# Patient Record
Sex: Female | Born: 1965 | Race: White | Hispanic: No | Marital: Married | Smoking: Never smoker
Health system: Southern US, Community
[De-identification: ages and names within clinical notes are randomized; demographics above are authoritative.]

## PROBLEM LIST (undated history)

## (undated) ENCOUNTER — Emergency Department: Discharge status: Absent without leave | Payer: 59

---

## 2003-01-11 ENCOUNTER — Encounter: Admission: RE | Admit: 2003-01-11 | Discharge: 2003-01-11 | Payer: Self-pay | Admitting: Family Medicine

## 2003-01-11 ENCOUNTER — Encounter: Payer: Self-pay | Admitting: Family Medicine

## 2003-02-06 ENCOUNTER — Encounter (INDEPENDENT_AMBULATORY_CARE_PROVIDER_SITE_OTHER): Payer: Self-pay | Admitting: *Deleted

## 2003-02-06 ENCOUNTER — Ambulatory Visit (HOSPITAL_COMMUNITY): Admission: RE | Admit: 2003-02-06 | Discharge: 2003-02-06 | Payer: Self-pay | Admitting: Gastroenterology

## 2003-02-08 ENCOUNTER — Other Ambulatory Visit: Admission: RE | Admit: 2003-02-08 | Discharge: 2003-02-08 | Payer: Self-pay | Admitting: Obstetrics and Gynecology

## 2003-02-11 ENCOUNTER — Encounter: Payer: Self-pay | Admitting: Obstetrics and Gynecology

## 2003-02-11 ENCOUNTER — Ambulatory Visit (HOSPITAL_COMMUNITY): Admission: RE | Admit: 2003-02-11 | Discharge: 2003-02-11 | Payer: Self-pay | Admitting: Obstetrics and Gynecology

## 2003-02-21 ENCOUNTER — Encounter: Payer: Self-pay | Admitting: Obstetrics and Gynecology

## 2003-02-21 ENCOUNTER — Ambulatory Visit (HOSPITAL_COMMUNITY): Admission: RE | Admit: 2003-02-21 | Discharge: 2003-02-21 | Payer: Self-pay | Admitting: Obstetrics and Gynecology

## 2003-03-20 ENCOUNTER — Observation Stay (HOSPITAL_COMMUNITY): Admission: RE | Admit: 2003-03-20 | Discharge: 2003-03-21 | Payer: Self-pay | Admitting: *Deleted

## 2003-03-20 ENCOUNTER — Encounter (INDEPENDENT_AMBULATORY_CARE_PROVIDER_SITE_OTHER): Payer: Self-pay | Admitting: *Deleted

## 2004-12-24 ENCOUNTER — Ambulatory Visit (HOSPITAL_COMMUNITY): Admission: RE | Admit: 2004-12-24 | Discharge: 2004-12-24 | Payer: Self-pay | Admitting: Obstetrics and Gynecology

## 2005-02-26 ENCOUNTER — Other Ambulatory Visit: Admission: RE | Admit: 2005-02-26 | Discharge: 2005-02-26 | Payer: Self-pay | Admitting: Obstetrics and Gynecology

## 2006-03-24 ENCOUNTER — Ambulatory Visit (HOSPITAL_COMMUNITY): Admission: RE | Admit: 2006-03-24 | Discharge: 2006-03-24 | Payer: Self-pay | Admitting: Obstetrics and Gynecology

## 2006-04-04 ENCOUNTER — Other Ambulatory Visit: Admission: RE | Admit: 2006-04-04 | Discharge: 2006-04-04 | Payer: Self-pay | Admitting: Obstetrics and Gynecology

## 2007-03-30 ENCOUNTER — Ambulatory Visit (HOSPITAL_COMMUNITY): Admission: RE | Admit: 2007-03-30 | Discharge: 2007-03-30 | Payer: Self-pay | Admitting: Obstetrics and Gynecology

## 2008-04-23 ENCOUNTER — Ambulatory Visit (HOSPITAL_COMMUNITY): Admission: RE | Admit: 2008-04-23 | Discharge: 2008-04-23 | Payer: Self-pay | Admitting: Obstetrics and Gynecology

## 2009-05-30 ENCOUNTER — Ambulatory Visit (HOSPITAL_COMMUNITY): Admission: RE | Admit: 2009-05-30 | Discharge: 2009-05-30 | Payer: Self-pay | Admitting: Obstetrics and Gynecology

## 2010-06-12 ENCOUNTER — Ambulatory Visit (HOSPITAL_COMMUNITY): Admission: RE | Admit: 2010-06-12 | Discharge: 2010-06-12 | Payer: Self-pay | Admitting: Obstetrics and Gynecology

## 2011-01-01 NOTE — H&P (Signed)
Kara Best, Kara Best                          ACCOUNT NO.:  [id number]   MEDICAL RECORD NO.:  [id number]                   PATIENT TYPE:  AMB   LOCATION:  DAY                                  FACILITY:  [facility]   PHYSICIAN:  Huel Cote, M.D.              DATE OF BIRTH:  1966/06/04   DATE OF ADMISSION:  03/20/2003  DATE OF DISCHARGE:                                HISTORY & PHYSICAL   HISTORY OF PRESENT ILLNESS:  The patient is a 45 year old G3, P3, who is  being admitted for both an elective cholecystectomy given the finding of  gallstones on CT scan and some flare ups and abdominal pain related to this  problem. On the same CT scan the patient was noted to have a right adnexal  mass which appeared  most consistent with a dermoid cyst and  included  some  solid components measuring approximately  2 to 3 cm in size. Given that the  patient is undergoing  the procedure for her cholecystectomy, she wishes  a  simultaneous evaluation  and removal of this right ovarian tumor as well as  a visualization of  the left  ovary which has been noted to have a simple  cyst in the past.   PAST MEDICAL HISTORY:  1. Recently diagnosed ulcerative colitis for which she is on medication with     Dr. Loreta Ave.  2. Some history of borderline thyroid disease, although this  is not     currently requiring medication.   PAST SURGICAL HISTORY:  1. Postpartum tubal ligation in 1991.  2. Tonsillectomy in 1978.   GYNECOLOGIC HISTORY:  Significant for colposcopy approximately 1 years ago  to follow some abnormal Pap smears which was normal. The Pap smear performed  February 08, 2003, was normal.   FAMILY HISTORY:  Her family history has no breast cancer or colon cancer and  heart disease only in her paternal grandparents in their 97s.   OBSTETRIC HISTORY:  Significant for 3 normal spontaneous vaginal deliveries  in the past. 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 After  careful discussion, the patient wishes to proceed with having it removed.   PHYSICAL EXAMINATION:  GENERAL:  Height 5 feet, 5 inches. Weight 158 pounds.  VITAL SIGNS:  Blood pressure is normal.  HEART:  Regular rate and rhythm.  LUNGS:  Clear.  ABDOMEN:  Soft, nontender.  BREASTS:  No masses, discharge or adenopathy  noted.  PELVIC:  Normal external genitalia. The cervix has no lesions. The uterus is  retroverted but normal in size. The right adnexa palpates actually within  normal limits. The left adnexa palpates slightly enlarged.   LABORATORY DATA:  On ultrasound February 11, 2003, the patient had a normal  uterus. The right ovary measured normal at 4 x 2 x 3.5, however, there was a  focal round hypoechoic  mass in it,  corresponding to the fatty mass noted  on CT scan. The left ovary was normal with multiple  small cysts noted.   PLAN:  The patient was counseled as to the risks and benefits of proceeding  with surgery, given that she will already have a laparoscopic camera in  place to perform her cholecystectomy. We can use that same port through the  umbilicus  and will be able to visualize the ovaries. We will proceed with a  right oophorectomy, given the changes on that ovary. The patient  wishes  this rather then a cystectomy, and pathological evaluation of the ovary  specimen. Also we will evaluate the left ovary at the time of surgery for  any residual cyst.   The risks and benefits of the procedure were discussed with the patient in  detail, including  bleeding, infection and possible damage to bowel and  bladder. The patient understands the risks and desires to proceed with the  surgery as stated.                                               Huel Cote,  M.D.    KR/MEDQ  D:  03/19/2003  T:  03/19/2003  Job:  [id number]

## 2011-01-01 NOTE — Op Note (Signed)
   [name], Kara Best                          ACCOUNT NO.:  [id number]   MEDICAL RECORD NO.:  [id number]                   PATIENT TYPE:  OBV   LOCATION:  0464                                 FACILITY:  [facility]   PHYSICIAN:  Vikki Ports, M.D.         DATE OF BIRTH:  01/25/1966   DATE OF PROCEDURE:  03/20/2003  DATE OF DISCHARGE:  03/21/2003                                 OPERATIVE REPORT   PREOPERATIVE DIAGNOSIS:  Symptomatic cholelithiasis.   POSTOPERATIVE DIAGNOSIS:  Symptomatic cholelithiasis.   PROCEDURE:  Laparoscopic cholecystectomy.   SURGEON:  Vikki Ports, M.D.   ASSISTANT:  Gita Kudo, M.D.   ANESTHESIA:  General.   DESCRIPTION OF PROCEDURE:  The patient was taken to the operating room and  placed in the supine position.  After adequate general anesthesia was  induced, the abdomen was prepped and draped in the normal sterile fashion.  Using a transverse infraumbilical incision, we dissected down to the fascia.  The fascia was opened vertically.  An 0 Vicryl pursestring suture was placed  around the fascial defect.  Pneumoperitoneum was then obtained.  Under  direct visualization a 10 mm port was placed in the subxiphoid region and  two 5 mm ports were placed in the right abdomen.  The gallbladder was  identified and retracted cephalad and laterally.  The neck of the  gallbladder was identified, the cystic duct was dissected bluntly, a good  window was created behind it.  Its junction with the gallbladder was  verified, and it was triply clipped and divided.  The cystic artery was  dissected in a similar fashion, triply clipped and divided.  The gallbladder  was taken off the gallbladder bed using Bovie electrocautery and removed  through the umbilical port.  Adequate hemostasis was ensured.  At this point  Huel Cote, M.D., proceeded with laparoscopic right salpingo-  oophorectomy, which will be dictated in a separate report.                                             Vikki Ports, M.D.    KRH/MEDQ  D:  04/30/2003  T:  04/30/2003  Job:  [id number]

## 2011-01-01 NOTE — Op Note (Signed)
NAMESKII, CLELAND                          ACCOUNT NO.:  [id number]   MEDICAL RECORD NO.:  [id number]                   PATIENT TYPE:  OBV   LOCATION:  0464                                 FACILITY:  [facility]   PHYSICIAN:  Huel Cote, M.D.              DATE OF BIRTH:  1966-03-08   DATE OF PROCEDURE:  03/20/2003  DATE OF DISCHARGE:                                 OPERATIVE REPORT   PREOPERATIVE DIAGNOSES:  1. Cholelithiasis.  2. Right ovarian mass.   POSTOPERATIVE DIAGNOSES:  1. Cholelithiasis.  2. Right ovarian mass.  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   PROCEDURE:  Laparoscopic cholecystectomy by Dr. Luan Pulling and Dr. Maryagnes Amos,  laparoscopic right salpingo-oophorectomy by Dr. Senaida Ores and Dr.  Jackelyn Knife.   SURGEON:  Vikki Ports, M.D. and Gita Kudo, M.D., Huel Cote, M.D. with Zenaida Niece, M.D.   ANESTHESIA:  General   FLUIDS:  Estimated blood loss minimal, urine output 100 mL straight  catheterization prior to procedure. IV fluids 1200 mL.   FINDINGS:  A 2 cm right ovarian dermoid cyst.   DESCRIPTION OF PROCEDURE:  I entered the operating room as the patient had  already undergone her laparoscopic cholecystectomy with Dr. Luan Pulling and  was under general anesthesia in the dorsal lithotomy position. A uterine  manipulator with acorn tenaculum had already been placed prior to surgery  vaginally. The patient was draped in the dorsal lithotomy position with a  Hasson trocar in her umbilicus and an additional 12 mm trocar off the  midline in her right upper quadrant. She also had two additional 5 mm  trocars just adjacent to the midline on the right side. The pelvis was  inspected with a camera in the Hasson trocar and the right ovary noted  slightly enlarged with an approximately 2-3 cm dermoid presumed cyst. It was  smooth and mobile and there were no other areas of abnormality. The left  ovary appeared completely  within normal limits. The uterus appeared normal.  One additional 5 mm trocar was placed in the lower left quadrant after  injection with 0.5% Marcaine. The atraumatic grasper was then utilized to  grasp the ovary and tube and the utero-ovarian ligament and the  infundibulopelvic ligament were taken down with the ________ cutting cautery  unit sequentially thus completely removing the ovary and tube. The remaining  pedicle was also cauterized with Kleppinger cautery for excellent  hemostasis. There was no active bleeding noted. The ovary and tube were then  removed with an endobag placed through the Hasson umbilicus port and sent  off for frozen pathology. At this point, the Hasson was completely removed  and the fascia closed with a preplaced pursestring suture of #0 Vicryl which  was cinched down with no defect noted. The camera was introduced into the  other 12 mm port and all pedicles appeared hemostatic therefore the 5 mm  ports were removed under direct visualization with no active bleeding noted.  Finally the 12 mm port was removed after the pneumoperitoneum had been  reduced. The remaining ports were closed, one deep suture of #0 Vicryl was  placed in the 12 mm port and skin  closed with 3-0 Vicryl in a subcuticular stitch at all other port sites.  Sponge, lap and needle counts were correct x2. The tenaculum and acorn  manipulator were removed from the vagina and the patient was taken to the  recovery room in stable condition.                                               Huel Cote, M.D.    KR/MEDQ  D:  03/20/2003  T:  03/20/2003  Job:  [id number]

## 2011-01-01 NOTE — Op Note (Signed)
Kara Best, Kara Best                            ACCOUNT NO.:  [id number]   MEDICAL RECORD NO.:  [id number]                   PATIENT TYPE:  AMB   LOCATION:  ENDO                                 FACILITY:  [facility]   PHYSICIAN:  Anselmo Rod, M.D.               DATE OF BIRTH:  May 09, 1966   DATE OF PROCEDURE:  02/06/2003  DATE OF DISCHARGE:                                 OPERATIVE REPORT   PROCEDURE PERFORMED:  Colonoscopy with biopsies times eight.   ENDOSCOPIST:  Charna Elizabeth, M.D.   INSTRUMENT USED:  Olympus video colonoscope.   INDICATIONS FOR PROCEDURE:  The patient is a 45 year old white female with a  history of bloody mucoid stools, change in bowel habits and rule out  inflammatory bowel disease.  The patient is to be scheduled for laparoscopic  cholecystectomy with Vikki Ports, M.D. and removal of a left  ovarian cyst by Huel Cote, M.D.  Therefore, preoperative colonoscopy  is being done to rule out rectal bleeding.   PREPROCEDURE PREPARATION:  Informed consent was procured from the patient.  The patient was fasted for eight hours prior to the procedure and prepped  with a bottle of magnesium citrate and a gallon of GoLYTELY the night prior  to the procedure.   PREPROCEDURE PHYSICAL:  The patient had stable vital signs.  Neck supple.  Chest clear to auscultation.  S1 and S2 regular.  Abdomen soft with normal  bowel sounds.   DESCRIPTION OF PROCEDURE:  The patient was placed in left lateral decubitus  position and sedated with 80 mg of Demerol and 8 mg of Versed intravenously.  Once the patient was adequately sedated and maintained on low flow oxygen  and continuous cardiac monitoring, the Olympus video colonoscope was  advanced from the rectum to the cecum and terminal ileum without difficulty.  The transient ischemic attack appeared normal.  The appendicular orifice and  the ileocecal valve were clearly visualized and photographed.  The cecum,  right  colon, transverse colon and proximal left colon appeared normal.  Patchy inflammatory changes were noted in the left colon from 20 to 40 cm.  This was biopsied for pathology.  The rest of the colonic mucosa appeared  healthy with a normal vascular pattern.  Retroflexion in the rectum revealed  no abnormalities.  There was some extrinsic compression of the rectum which  may be from the patient's ovarian cyst.  No hemorrhoids were noted.  The  patient tolerated the procedure well without complications.  Biopsies were  done from 20 to 40 cm to confirm the diagnosis of inflammatory bowel  disease.   IMPRESSION:  1. Inflammatory changes from 20 to 40 cm in left colon.  2. Otherwise, normal-appearing proximal left colon, transverse colon, right     colon, cecum and terminal ileum.  3. Extrinsic compression of the rectum, question ovarian cyst.  RECOMMENDATIONS:  1. Await pathology results.  2. Outpatient follow-up in the next one week for further recommendations.                                                   Anselmo Rod, M.D.    JNM/MEDQ  D:  02/06/2003  T:  02/07/2003  Job:  [id number]   cc:   Teena Irani. Arlyce Dice, M.D.  [address]  [place]  [place] [address]  Fax: [phone]   Vikki Ports, M.D.  [address]  [place]  [place] [address]  Fax: [phone]   Huel Cote, M.D.  [address], [address]  [place], [place] [address]  Fax: [phone]

## 2011-08-18 ENCOUNTER — Other Ambulatory Visit (HOSPITAL_COMMUNITY): Payer: Self-pay | Admitting: Obstetrics and Gynecology

## 2011-08-18 DIAGNOSIS — Z1231 Encounter for screening mammogram for malignant neoplasm of breast: Secondary | ICD-10-CM

## 2011-09-23 ENCOUNTER — Ambulatory Visit (HOSPITAL_COMMUNITY)
Admission: RE | Admit: 2011-09-23 | Discharge: 2011-09-23 | Disposition: A | Discharge status: Home / Self Care | Payer: 59 | Source: Ambulatory Visit | Attending: Obstetrics and Gynecology | Admitting: Obstetrics and Gynecology

## 2011-09-23 DIAGNOSIS — Z1231 Encounter for screening mammogram for malignant neoplasm of breast: Secondary | ICD-10-CM | POA: Insufficient documentation

## 2012-01-11 ENCOUNTER — Ambulatory Visit (INDEPENDENT_AMBULATORY_CARE_PROVIDER_SITE_OTHER): Payer: 59 | Admitting: Family Medicine

## 2012-01-11 ENCOUNTER — Encounter: Payer: Self-pay | Admitting: Family Medicine

## 2012-01-11 VITALS — Ht 65.25 in | Wt 153.1 lb

## 2012-01-11 DIAGNOSIS — E785 Hyperlipidemia, unspecified: Secondary | ICD-10-CM

## 2012-01-11 NOTE — Patient Instructions (Addendum)
-   Eat at least 3 meals and 1-2 snacks per day.  Aim for no more than 5 hours between eating.   - Self-discipline: Diminishes as blood sugars drop.   - Meals should look like, taste like, feel like a REAL MEAL.   - Obtain twice as many veg's as protein or carbohydrate foods for both lunch and dinner; use fruit for snacks primarily.  - Protein at each meal:  Beans, soy, meat, fish, poultry, dairy foods, eggs, (nuts and seeds).    Nuts & seeds:  Limit to 2-3 tbsp/day; XV olive oil is excellent, but be careful about quantity.  (High-protein pasta:  [place] pasta) - Plan left-overs from foods on hand:  - Colored peppers over wholewheat pasta - Zucch, squash, onion, or asparagus roasted - Spinach, berries (or tomatoes in omoelette) - Cheese sticks - Skim milk - Fruit to eat or in smoothie - Hummus w/ chapati - Steel-cut oats in yogurt - Shredded Wheat w/ milk - Grits, millet - Chicken breast - Edamame, beans ([employer] Foods has canned no-salt-added for <$1) - Breakfast:  Include protein, fruit, at least one starch, i.e., egg (or nut butter) sandwich on Ezekial Eng muffin plus fruit; cereal, milk or yogurt, fruit; Cooked cereal and fruit, glass of milk - Snack: Mix & Match: Fruit, yogurt, cheese, 1/2 sandwich, nuts/seeds, hummus, cereal, raw veg's, whole-grain crackers, popcorn:  100-300 calories.   - Email to [email or web address]:  List of meals.  I will review and make suggestions.

## 2012-01-11 NOTE — Progress Notes (Signed)
Medical Nutrition Therapy:  Appt start time: 1400 end time:  1500.  Assessment:  Primary concerns today: Weight management and lipids management.  Ms. Bowdish has a fam hx of hyperlipidemia and hypertension.  She has been working on lowering each of these in the few months.    Usual eating pattern is erratic.  Work schedule starts at [employer] AM 3 X wk, working for [employer] at [employer].  She works 9:15-5:45 twice a week.  Consequently, her sleep schedule is varied thru the week.  She also has children, one married, one in [place], and one in college.  She & her husband have been trying to improve their diets together.  Ms. Deringer seems to have very all-or-none approach to food choices.  She also has a history of ulcerative colitis.    Everyday foods include decaf coffee with Starbuck's hot choc mix (as of today switching to Truvia/stevia and cocoa powder).  Avoided foods include sugar, alcohol, processed and fast foods, most fish/seafood, and most red meat.  Ms. Oconnell started losing wt using the Frederick Peers, using only vegan foods for about a month.  Subsequent lab work showed a low B12 level.  She has since added some animal products (dairy and occasional meat).   24-hr recall (worked yesterday, 4:15 -11:45) was somewhat atypical in that they had a burger and ice cream: B (5:30 AM)-    3/4 c Greek nonfat yogurt, 1 c blueberries Snk (9:45 AM)-   1 c homemade veg soup, chx breast, salad, olive oil & vinegar, water Snk (11:15 AM)-  Starbuck's 16 oz nonfat decaf Mocha Snk (1 PM)-   1/2 c microwaved popcorn in olive oil, salt, water D ( PM)-   1 grilled hamburger, avocado, let, tomato, watermelon, 1/2 c ice cream, water Snk ( PM)-   1/2 orange  Usual physical activity includes water aerobics, walking, or elliptical/bike ~35-60 min 5 X wk.  Progress Towards Goal(s):  In progress.   Nutritional Diagnosis:  NB-1.1 Food and nutrition-related knowledge deficit As related to moderation  and balance.  As evidenced by self-report of history of following diet plans briefly, but discontinuing after a while.    Intervention:  Nutrition education.  Monitoring/Evaluation:  Dietary intake, exercise, and body weight in 4 weeks.

## 2012-02-15 ENCOUNTER — Ambulatory Visit: Payer: 59 | Admitting: Family Medicine

## 2012-07-24 ENCOUNTER — Encounter: Payer: Self-pay | Admitting: Sports Medicine

## 2012-07-24 ENCOUNTER — Ambulatory Visit (INDEPENDENT_AMBULATORY_CARE_PROVIDER_SITE_OTHER): Payer: 59 | Admitting: Sports Medicine

## 2012-07-24 VITALS — BP 131/81 | HR 97 | Wt 164.0 lb

## 2012-07-24 DIAGNOSIS — M79671 Pain in right foot: Secondary | ICD-10-CM

## 2012-07-24 DIAGNOSIS — M722 Plantar fascial fibromatosis: Secondary | ICD-10-CM | POA: Insufficient documentation

## 2012-07-24 DIAGNOSIS — R269 Unspecified abnormalities of gait and mobility: Secondary | ICD-10-CM | POA: Insufficient documentation

## 2012-07-24 DIAGNOSIS — M79609 Pain in unspecified limb: Secondary | ICD-10-CM

## 2012-07-24 NOTE — Assessment & Plan Note (Addendum)
With pain chronically underneath the right first metatarsal head, but predominantly over the flexor hallucis longus tendon both proximal and distal to the first metatarsal head, but not just under it. We did make custom orthotics today, we will try these for a week, after which we will consider placing a first metatarsal ray post if not improved. Next would also be consideration of ultrasound guided flexor hallucis longus tendon sheath injection, versus first metatarsophalangeal joint injection for diagnostic and therapeutic purposes. Home rehabilitation.

## 2012-07-24 NOTE — Assessment & Plan Note (Signed)
See above for assessment for foot pain.

## 2012-07-24 NOTE — Progress Notes (Signed)
SPORTS MEDICINE CONSULTATION REPORT  Subjective:    CC: Foot pain  HPI: I met with the at the [employer] health fair. She is a very pleasant 46 year old female who spends all day on her feet. Unfortunately, chronically she developed pain which he localizes on the plantar aspect of the right great toe just proximal, and just distal to the metatarsophalangeal joint, but not over the joint itself. She did have x-rays done with a different provider that were essentially normal per patient. She has been in multiple different types of orthotics, and all given her some benefit, but none of really taken away the pain completely. The pain is localized, does not radiate, and his only present with specific areas of palpation, as well as with weight bearing all day. She is interested in custom orthotics.  Past medical history, Surgical history, Family history, Social history, Allergies, and medications have been entered into the medical record, reviewed, and no changes needed.   Review of Systems: No headache, visual changes, nausea, vomiting, diarrhea, constipation, dizziness, abdominal pain, skin rash, fevers, chills, night sweats, weight loss, swollen lymph nodes, body aches, joint swelling, muscle aches, chest pain, shortness of breath, mood changes, visual or auditory hallucinations.   Objective:   Vitals:  Afebrile, vital signs stable. General: Well Developed, well nourished, and in no acute distress.  Neuro/Psych: Alert and oriented x3, extra-ocular muscles intact, able to move all 4 extremities.  Skin: Warm and dry, no rashes noted.  Respiratory: Not using accessory muscles, speaking in full sentences, trachea midline.  Cardiovascular: Pulses palpable, no extremity edema. Abdomen: Does not appear distended. Right Ankle: No visible erythema or swelling. Range of motion is full in all directions. Strength is 5/5 in all directions. Stable lateral and medial ligaments; squeeze test and kleiger  test unremarkable; Talar dome nontender; No pain at base of 5th MT; No tenderness over cuboid; No tenderness over N spot or navicular prominence No tenderness on posterior aspects of lateral and medial malleolus No sign of peroneal tendon subluxations or tenderness to palpation Negative tarsal tunnel tinel's Able to walk 4 steps. There is a discrete area of tenderness to palpation all on what feels to be the flexor hallucis longus tendon, there is no tenderness to palpation over the metatarsophalangeal joint itself. I also cannot reproduce this pain with resisted plantar flexion of the great toe, or small toes, and she has no pain behind the malleolus.  Patient was fitted for a : standard, cushioned, semi-rigid orthotic. The orthotic was heated and afterward the patient stood on the orthotic blank positioned on the orthotic stand. The patient was positioned in subtalar neutral position and 10 degrees of ankle dorsiflexion in a weight bearing stance. After completion of molding, a stable base was applied to the orthotic blank. The blank was ground to a stable position for weight bearing. Size: 7 Base: Blue EVA Additional Posting and Padding: None The patient ambulated these, and they were very comfortable.  Impression and Recommendations:   This case required medical decision making of moderate complexity.  I spent over 45 minutes with this patient, 50% was face-to-face time.

## 2012-07-28 ENCOUNTER — Encounter: Payer: Self-pay | Admitting: Family Medicine

## 2012-08-14 ENCOUNTER — Ambulatory Visit (INDEPENDENT_AMBULATORY_CARE_PROVIDER_SITE_OTHER): Payer: Self-pay | Admitting: Sports Medicine

## 2012-08-14 ENCOUNTER — Encounter: Payer: Self-pay | Admitting: Sports Medicine

## 2012-08-14 VITALS — BP 133/87 | HR 88 | Wt 167.0 lb

## 2012-08-14 DIAGNOSIS — M79671 Pain in right foot: Secondary | ICD-10-CM

## 2012-08-14 DIAGNOSIS — M79609 Pain in unspecified limb: Secondary | ICD-10-CM

## 2012-08-14 NOTE — Progress Notes (Signed)
SPORTS MEDICINE CONSULTATION REPORT  Subjective:    CC: Followup  HPI: Kara Best comes back to see me for followup of her right foot pain. She initially had pain that she localized over the mid and distal plantar fascia. I built her custom orthotics, and we avoided NSAIDs due to her ulcerative colitis.  Unfortunately she notes that the pain is not much better. She has been working aggressively on the rehabilitation exercises. She does note that her orthotics feel somewhat too short. It is localized, doesn't radiate, is moderate. She does work for [employer] and spends all day on her feet  Past medical history, Surgical history, Family history, Social history, Allergies, and medications have been entered into the medical record, reviewed, and no changes needed.   Review of Systems: No headache, visual changes, nausea, vomiting, diarrhea, constipation, dizziness, abdominal pain, skin rash, fevers, chills, night sweats, weight loss, swollen lymph nodes, body aches, joint swelling, muscle aches, chest pain, shortness of breath, mood changes, visual or auditory hallucinations.   Objective:   Vitals:  Afebrile, vital signs stable. General: Well Developed, well nourished, and in no acute distress.  Neuro/Psych: Alert and oriented x3, extra-ocular muscles intact, able to move all 4 extremities.  Skin: Warm and dry, no rashes noted.  Respiratory: Not using accessory muscles, speaking in full sentences, trachea midline.  Cardiovascular: Pulses palpable, no extremity edema. Abdomen: Does not appear distended. Right foot: Her foot shape is neutral, she has tenderness to palpation as well as palpable nodularity along the distal are fascia medially. This is consistent with plantar fascia fibromatosis.  Patient was fitted for a : standard, cushioned, semi-rigid orthotic. The orthotic was heated and afterward the patient stood on the orthotic blank positioned on the orthotic stand. The patient was  positioned in subtalar neutral position and 10 degrees of ankle dorsiflexion in a weight bearing stance. After completion of molding, a stable base was applied to the orthotic blank. The blank was ground to a stable position for weight bearing. Size: 10 Base: Blue EVA Additional Posting and Padding: None The patient ambulated these, and they were very comfortable.  Procedure: Real-time Ultrasound Guided Injection of right plantar fascia fibromatosis. Device: GE Logiq E  Ultrasound guided injection is preferred based studies that show increased duration, increased effect, greater accuracy, decreased procedural pain, increased response rate, and decreased cost with ultrasound guided versus blind injection.  Verbal informed consent obtained.  Time-out conducted.  Noted no overlying erythema, induration, or other signs of local infection.  Skin prepped in a sterile fashion.  Local anesthesia: Topical Ethyl chloride.  With sterile technique and under real time ultrasound guidance:  I noted edema superficial to one and surrounding the distal plantar fascia in a somewhat localized area corresponding to her pain. I advanced the needle in the short axis into this area of edema, I injected 1 cc Kenalog 40, 3 cc lidocaine and watched it spread proximally and distally over the plantar fascia. Completed without difficulty  Pain immediately resolved suggesting accurate placement of the medication.  Advised to call if fevers/chills, erythema, induration, drainage, or persistent bleeding.  Images permanently stored and available for review in the ultrasound unit.  Impression: Technically successful ultrasound guided injection.  Impression and Recommendations:   This case required medical decision making of moderate complexity.  I spent over 40 minutes with this patient, greater than 50% was face-to-face counseling regarding plantar fascia fibromatosis.

## 2012-08-14 NOTE — Assessment & Plan Note (Signed)
Her still some pain on the right side over the plantar fascia suggestive of plantar fascia fibromatosis. There is no pain at the calcaneal insertion of the plantar fascia. Previous orthotics were too short, are reconstructed longer ones. I also injected over the plantar fascia under guidance around an area of edema where some fibromatosis was seen. I would like to see her back in a few weeks to see if things are going, again, I can add a scaphoid pattern first metatarsal ray post if she continues to have plantar fascia fibromatosis pain.

## 2012-09-05 ENCOUNTER — Ambulatory Visit: Payer: Self-pay | Admitting: Sports Medicine

## 2012-09-14 ENCOUNTER — Ambulatory Visit: Payer: Self-pay | Admitting: Sports Medicine

## 2012-09-21 ENCOUNTER — Ambulatory Visit: Payer: Self-pay | Admitting: Sports Medicine

## 2012-09-30 ENCOUNTER — Other Ambulatory Visit: Payer: Self-pay

## 2013-01-02 ENCOUNTER — Other Ambulatory Visit (HOSPITAL_COMMUNITY): Payer: Self-pay | Admitting: Obstetrics and Gynecology

## 2013-01-02 DIAGNOSIS — Z1231 Encounter for screening mammogram for malignant neoplasm of breast: Secondary | ICD-10-CM

## 2013-02-05 ENCOUNTER — Ambulatory Visit (HOSPITAL_COMMUNITY): Payer: Self-pay

## 2013-02-12 ENCOUNTER — Ambulatory Visit (HOSPITAL_COMMUNITY): Payer: 59

## 2013-02-19 ENCOUNTER — Ambulatory Visit (HOSPITAL_COMMUNITY)
Admission: RE | Admit: 2013-02-19 | Discharge: 2013-02-19 | Disposition: A | Discharge status: Home / Self Care | Payer: 59 | Source: Ambulatory Visit | Attending: Obstetrics and Gynecology | Admitting: Obstetrics and Gynecology

## 2013-02-19 DIAGNOSIS — Z1231 Encounter for screening mammogram for malignant neoplasm of breast: Secondary | ICD-10-CM | POA: Insufficient documentation

## 2013-06-21 ENCOUNTER — Other Ambulatory Visit: Payer: Self-pay

## 2013-11-14 ENCOUNTER — Encounter: Payer: Self-pay | Admitting: Sports Medicine

## 2013-11-14 ENCOUNTER — Ambulatory Visit (INDEPENDENT_AMBULATORY_CARE_PROVIDER_SITE_OTHER): Payer: 59 | Admitting: Sports Medicine

## 2013-11-14 VITALS — BP 134/89 | HR 89 | Ht 65.0 in | Wt 165.0 lb

## 2013-11-14 DIAGNOSIS — M79673 Pain in unspecified foot: Secondary | ICD-10-CM

## 2013-11-14 DIAGNOSIS — Q742 Other congenital malformations of lower limb(s), including pelvic girdle: Secondary | ICD-10-CM

## 2013-11-14 DIAGNOSIS — M79609 Pain in unspecified limb: Secondary | ICD-10-CM

## 2013-11-14 DIAGNOSIS — M25579 Pain in unspecified ankle and joints of unspecified foot: Secondary | ICD-10-CM

## 2013-11-14 NOTE — Progress Notes (Signed)
CC: Bilateral foot pain HPI: Patient presents for evaluation of bilateral foot pain. This is been bothering her for about 10 years. She has had 2 sets of custom orthotics in the past including cushioned orthotics made by Dr. Althea CharonMcKinley. Unfortunately both of these made her pain worse. She notes that she is developing a bunion on her right foot. She also has had very prominent bone spurs over her medial feet at the area of the navicular. She actually had surgery on the right foot to remove this bony prominence. She notes pain at the inferior aspect of where they removed this bone. She is wondering if there might be something that we can do to help her with her foot pain. She has to stand all day at work and has a lot of difficulty doing this.  ROS: As above in the HPI. All other systems are stable or negative.  OBJECTIVE: APPEARANCE:  Patient in no acute distress.The patient appeared well nourished and normally developed. HEENT: No scleral icterus. Conjunctiva non-injected Resp: Non labored Skin: No rash MSK:  Left foot: - Large accessory navicular is prominent and extends past the medial malleolus on the left foot - Large bone spur off of medial malleolus - Hallux rigidus - Pes planus with mild pronation Right foot - Status post surgery over the navicular - Hallux limitus - External rotation of the right foot with walking  Normal inversion of the heel with heel raise bilaterally, but more inversion on the right   MSK US: Not performed   ASSESSMENT: #1. 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 We will try her in a soft cushioned diabetic foot insert with small blue foam pad over the arch to try to cushion the excess or navicular. In her other dress  shoes we added a blue foam pad to the posterior half of the insole. She will try these for the next 3-4 weeks and followup with us for additional changes if needed.

## 2013-12-13 ENCOUNTER — Ambulatory Visit: Payer: Self-pay | Admitting: Sports Medicine

## 2014-02-27 ENCOUNTER — Other Ambulatory Visit (HOSPITAL_COMMUNITY): Payer: Self-pay | Admitting: Obstetrics and Gynecology

## 2014-02-27 DIAGNOSIS — Z1231 Encounter for screening mammogram for malignant neoplasm of breast: Secondary | ICD-10-CM

## 2014-03-08 ENCOUNTER — Ambulatory Visit (HOSPITAL_COMMUNITY)
Admission: RE | Admit: 2014-03-08 | Discharge: 2014-03-08 | Disposition: A | Discharge status: Home / Self Care | Payer: 59 | Source: Ambulatory Visit | Attending: Obstetrics and Gynecology | Admitting: Obstetrics and Gynecology

## 2014-03-08 DIAGNOSIS — Z1231 Encounter for screening mammogram for malignant neoplasm of breast: Secondary | ICD-10-CM | POA: Insufficient documentation

## 2016-01-28 ENCOUNTER — Emergency Department (INDEPENDENT_AMBULATORY_CARE_PROVIDER_SITE_OTHER)
Admission: EM | Admit: 2016-01-28 | Discharge: 2016-01-28 | Disposition: A | Discharge status: Home / Self Care | Payer: 59 | Source: Home / Self Care | Attending: Family Medicine | Admitting: Family Medicine

## 2016-01-28 DIAGNOSIS — J029 Acute pharyngitis, unspecified: Secondary | ICD-10-CM

## 2016-01-28 LAB — POCT RAPID STREP A (OFFICE): Rapid Strep A Screen: NEGATIVE

## 2016-01-28 NOTE — ED Notes (Signed)
Sore throat, headache, body aches since yesterday.  °

## 2016-01-28 NOTE — ED Provider Notes (Signed)
CSN: [id number]     Arrival date & time 01/28/16  [id number] History   First MD Initiated Contact with Patient 01/28/16 0831     Chief Complaint  Patient presents with  . Sore Throat      HPI Comments: Patient awoke yesterday with headache, sore throat, fatigue, and myalgias.  The history is provided by the patient.    No past medical history on file. No past surgical history on file. No family history on file. Social History  Substance Use Topics  . Smoking status: Never Smoker   . Smokeless tobacco: Not on file  . Alcohol Use: No   OB History    No data available     Review of Systems + sore throat No cough No pleuritic pain No wheezing No nasal congestion No post-nasal drainage No sinus pain/pressure No itchy/red eyes No earache No hemoptysis No SOB No fever/chills No nausea No vomiting No abdominal pain No diarrhea No urinary symptoms No skin rash + fatigue + myalgias + headache Used OTC meds without relief  Allergies  Ciprofloxacin and Sulfa antibiotics  Home Medications   Prior to Admission medications   Medication Sig Start Date End Date Taking? Authorizing Provider  diphenhydrAMINE (SIMPLY SLEEP) 25 MG tablet Take 25 mg by mouth at bedtime as needed.    Historical Provider, MD  levothyroxine (SYNTHROID, LEVOTHROID) 75 MCG tablet Take 75 mcg by mouth daily.    Historical Provider, MD  mesalamine (LIALDA) 1.2 G EC tablet Take 1,200 mg by mouth daily with breakfast.    Historical Provider, MD   Meds Ordered and Administered this Visit  Medications - No data to display  BP 130/87 mmHg  Pulse 105  Temp(Src) 98.5 F (36.9 C) (Oral)  Ht 5\' 5"  (1.651 m)  Wt 165 lb (74.844 kg)  BMI 27.46 kg/m2  SpO2 100%  LMP 01/19/2013 No data found.   Physical Exam Nursing notes and Vital Signs reviewed. Appearance:  Patient appears stated age, and in no acute distress Eyes:  Pupils are equal, round, and reactive to light and accomodation.  Extraocular movement  is intact.  Conjunctivae are not inflamed  Ears:  Canals normal.  Tympanic membranes normal.  Nose:  Mildly congested turbinates.  No sinus tenderness.    Pharynx:  Mild erythema Neck:  Supple.  Tender enlarged posterior/lateral nodes are palpated bilaterally  Lungs:  Clear to auscultation.  Breath sounds are equal.  Moving air well. Heart:  Regular rate and rhythm without murmurs, rubs, or gallops.  Abdomen:  Nontender without masses or hepatosplenomegaly.  Bowel sounds are present.  No CVA or flank tenderness.  Extremities:  No edema.  Skin:  No rash present.   ED Course  Procedures none    Labs Reviewed  POCT RAPID STREP A (OFFICE) negative      MDM   1. Acute pharyngitis, unspecified etiology; suspect early viral URI   There is no evidence of bacterial infection today.  Treat symptomatically for now  Throat culture pending.  If increasing cold-like symptoms develop, try the following: Take plain guaifenesin (1200mg  extended release tabs such as Mucinex) twice daily, with plenty of water, for cough and congestion.  May add Pseudoephedrine (30mg , one or two every 4 to 6 hours) for sinus congestion.  Get adequate rest.   May use Afrin nasal spray (or generic oxymetazoline) twice daily for about 5 days and then discontinue.  Also recommend using saline nasal spray several times daily and saline nasal irrigation (AYR is  a common brand).  Use Flonase nasal spray each morning after using Afrin nasal spray and saline nasal irrigation. Try warm salt water gargles for sore throat.  Stop all antihistamines for now, and other non-prescription cough/cold preparations. May take Tylenol, or low dose ibuprofen for sore throat, headache, body aches, etc. May take Delsym Cough Suppressant at bedtime for nighttime cough.  Follow-up with family doctor if not improving about 7 to10 days.     Lattie Haw, MD 01/28/16 0900

## 2016-01-28 NOTE — Discharge Instructions (Signed)
If increasing cold-like symptoms develop, try the following: Take plain guaifenesin (1200mg  extended release tabs such as Mucinex) twice daily, with plenty of water, for cough and congestion.  May add Pseudoephedrine (30mg , one or two every 4 to 6 hours) for sinus congestion.  Get adequate rest.   May use Afrin nasal spray (or generic oxymetazoline) twice daily for about 5 days and then discontinue.  Also recommend using saline nasal spray several times daily and saline nasal irrigation (AYR is a common brand).  Use Flonase nasal spray each morning after using Afrin nasal spray and saline nasal irrigation. Try warm salt water gargles for sore throat.  Stop all antihistamines for now, and other non-prescription cough/cold preparations. May take Tylenol, or low dose ibuprofen for sore throat, headache, body aches, etc. May take Delsym Cough Suppressant at bedtime for nighttime cough.  Follow-up with family doctor if not improving about 7 to10 days.    Strep Throat Strep throat is a bacterial infection of the throat. Your health care provider may call the infection tonsillitis or pharyngitis, depending on whether there is swelling in the tonsils or at the back of the throat. Strep throat is most common during the cold months of the year in children who are 525-52 years of age, but it can happen during any season in people of any age. This infection is spread from person to person (contagious) through coughing, sneezing, or close contact. CAUSES Strep throat is caused by the bacteria called Streptococcus pyogenes. RISK FACTORS This condition is more likely to develop in:  People who spend time in crowded places where the infection can spread easily.  People who have close contact with someone who has strep throat. SYMPTOMS Symptoms of this condition include:  Fever or chills.   Redness, swelling, or pain in the tonsils or throat.  Pain or difficulty when swallowing.  White or yellow spots on  the tonsils or throat.  Swollen, tender glands in the neck or under the jaw.  Red rash all over the body (rare). DIAGNOSIS This condition is diagnosed by performing a rapid strep test or by taking a swab of your throat (throat culture test). Results from a rapid strep test are usually ready in a few minutes, but throat culture test results are available after one or two days. TREATMENT This condition is treated with antibiotic medicine. HOME CARE INSTRUCTIONS Medicines  Take over-the-counter and prescription medicines only as told by your health care provider.  Take your antibiotic as told by your health care provider. Do not stop taking the antibiotic even if you start to feel better.  Have family members who also have a sore throat or fever tested for strep throat. They may need antibiotics if they have the strep infection. Eating and Drinking  Do not share food, drinking cups, or personal items that could cause the infection to spread to other people.  If swallowing is difficult, try eating soft foods until your sore throat feels better.  Drink enough fluid to keep your urine clear or pale yellow. General Instructions  Gargle with a salt-water mixture 3-4 times per day or as needed. To make a salt-water mixture, completely dissolve -1 tsp of salt in 1 cup of warm water.  Make sure that all household members wash their hands well.  Get plenty of rest.  Stay home from school or work until you have been taking antibiotics for 24 hours.  Keep all follow-up visits as told by your health care provider. This is  important. SEEK MEDICAL CARE IF:  The glands in your neck continue to get bigger.  You develop a rash, cough, or earache.  You cough up a thick liquid that is green, yellow-brown, or bloody.  You have pain or discomfort that does not get better with medicine.  Your problems seem to be getting worse rather than better.  You have a fever. SEEK IMMEDIATE MEDICAL CARE  IF:  You have new symptoms, such as vomiting, severe headache, stiff or painful neck, chest pain, or shortness of breath.  You have severe throat pain, drooling, or changes in your voice.  You have swelling of the neck, or the skin on the neck becomes red and tender.  You have signs of dehydration, such as fatigue, dry mouth, and decreased urination.  You become increasingly sleepy, or you cannot wake up completely.  Your joints become red or painful.   This information is not intended to replace advice given to you by your health care provider. Make sure you discuss any questions you have with your health care provider.   Document Released: 07/30/2000 Document Revised: 04/23/2015 Document Reviewed: 11/25/2014 Elsevier Interactive Patient Education [employer]

## 2016-01-29 LAB — STREP A DNA PROBE: GASP: NOT DETECTED

## 2016-01-31 ENCOUNTER — Telehealth: Payer: Self-pay | Admitting: Emergency Medicine

## 2016-02-09 ENCOUNTER — Encounter: Payer: Self-pay | Admitting: *Deleted

## 2016-02-09 ENCOUNTER — Emergency Department (INDEPENDENT_AMBULATORY_CARE_PROVIDER_SITE_OTHER)
Admission: EM | Admit: 2016-02-09 | Discharge: 2016-02-09 | Disposition: A | Discharge status: Home / Self Care | Payer: 59 | Source: Home / Self Care | Attending: Family Medicine | Admitting: Family Medicine

## 2016-02-09 DIAGNOSIS — IMO0001 Reserved for inherently not codable concepts without codable children: Secondary | ICD-10-CM

## 2016-02-09 DIAGNOSIS — H938X1 Other specified disorders of right ear: Secondary | ICD-10-CM

## 2016-02-09 DIAGNOSIS — R030 Elevated blood-pressure reading, without diagnosis of hypertension: Secondary | ICD-10-CM | POA: Diagnosis not present

## 2016-02-09 MED ORDER — PREDNISONE 20 MG PO TABS: ORAL_TABLET | ORAL | Status: AC

## 2016-02-09 NOTE — ED Provider Notes (Signed)
CSN: [id number]     Arrival date & time 02/09/16  1014 History   First MD Initiated Contact with Patient 02/09/16 1040     Chief Complaint  Patient presents with  . Ear Fullness   (Consider location/radiation/quality/duration/timing/severity/associated sxs/prior Treatment) HPI  Kara Best is a 50 y.o. female presenting to UC with c/o Right ear fullness for about 2 weeks. She is currently on a 7 day course of Amoxicillin 875mg  and has 3 days left but notes the Right ear fullness has not improved.  She has been taking tessalon and Atrovent w/o relief. Denies fever or chills but has had nausea w/o vomiting. No sick contacts or recent travel.   BP elevated in triage. Pt contributes to the Right ear pain. No hx of HTN.  Denies dizziness, chest pain or SOB.  History reviewed. No pertinent past medical history. History reviewed. No pertinent past surgical history. History reviewed. No pertinent family history. Social History  Substance Use Topics  . Smoking status: Never Smoker   . Smokeless tobacco: None  . Alcohol Use: No   OB History    No data available     Review of Systems  Constitutional: Negative for fever and chills.  HENT: Positive for congestion, ear pain ( Right), rhinorrhea, sinus pressure and sore throat. Negative for sneezing, trouble swallowing and voice change.   Respiratory: Positive for cough. Negative for shortness of breath.   Cardiovascular: Negative for chest pain and palpitations.  Gastrointestinal: Positive for nausea. Negative for vomiting, abdominal pain and diarrhea.  Musculoskeletal: Negative for myalgias, back pain and arthralgias.  Skin: Negative for rash.  Neurological: Positive for headaches (Right side). Negative for dizziness and light-headedness.    Allergies  Ciprofloxacin and Sulfa antibiotics  Home Medications   Prior to Admission medications   Medication Sig Start Date End Date Taking? Authorizing Provider  diphenhydrAMINE (SIMPLY  SLEEP) 25 MG tablet Take 25 mg by mouth at bedtime as needed.    Historical Provider, MD  levothyroxine (SYNTHROID, LEVOTHROID) 75 MCG tablet Take 75 mcg by mouth daily.    Historical Provider, MD  mesalamine (LIALDA) 1.2 G EC tablet Take 1,200 mg by mouth daily with breakfast.    Historical Provider, MD  predniSONE (DELTASONE) 20 MG tablet 3 tabs po day one, then 2 po daily x 4 days 02/09/16   Junius FinnerErin O'Malley, PA-C   Meds Ordered and Administered this Visit  Medications - No data to display  BP 153/102 mmHg  Pulse 109  Temp(Src) 98.6 F (37 C) (Oral)  Resp 16  Ht 5\' 5"  (1.651 m)  Wt 164 lb (74.39 kg)  BMI 27.29 kg/m2  SpO2 100%  LMP 01/19/2013 No data found.   Physical Exam  Constitutional: She appears well-developed and well-nourished. No distress.  HENT:  Head: Normocephalic and atraumatic.  Right Ear: Tympanic membrane is not erythematous. A middle ear effusion is present.  Left Ear: Tympanic membrane normal.  Nose: Mucosal edema present. Right sinus exhibits maxillary sinus tenderness and frontal sinus tenderness. Left sinus exhibits maxillary sinus tenderness. Left sinus exhibits no frontal sinus tenderness.  Mouth/Throat: Uvula is midline, oropharynx is clear and moist and mucous membranes are normal.  Eyes: Conjunctivae are normal. No scleral icterus.  Neck: Normal range of motion. Neck supple.  Cardiovascular: Normal rate, regular rhythm and normal heart sounds.   Mild tachycardia in triage, normal on exam.  Pulmonary/Chest: Effort normal and breath sounds normal. No respiratory distress. She has no wheezes. She has no rales.  Musculoskeletal: Normal range of motion.  Neurological: She is alert.  Skin: Skin is warm and dry. She is not diaphoretic.  Nursing note and vitals reviewed.   ED Course  Procedures (including critical care time)  Labs Review Labs Reviewed - No data to display  Imaging Review No results found.    MDM   1. Ear fullness, right   2.  Elevated blood pressure    Pt c/o Right ear fullness for 2 weeks despite taking 4 days of a 7 day course of Amoxicillin 875mg .   Middle ear effusion noted in Right TM.  Pt is afebrile.  Elevated BP, pt contributes to ear discomfort.  Rx: Prednisone.  Encouraged OTC sinus rinses. F/u with PCP in 1 week if not improving, sooner if worsening. Patient verbalized understanding and agreement with treatment plan.     Junius Finnerrin O'Malley, PA-C 02/09/16 1209

## 2016-02-09 NOTE — Discharge Instructions (Signed)
You should keep taking your antibiotics as prescribed until completely gone.  You may also find some relief with sinus rinses.

## 2016-02-09 NOTE — ED Notes (Signed)
Pt c/o RT ear fullness x 2 wks. She has Amoxicillin 875mg  x 7 days from another UC without relief. Denies pain.

## 2016-09-06 DIAGNOSIS — H93293 Other abnormal auditory perceptions, bilateral: Secondary | ICD-10-CM | POA: Diagnosis not present

## 2016-09-08 DIAGNOSIS — E7800 Pure hypercholesterolemia, unspecified: Secondary | ICD-10-CM | POA: Diagnosis not present

## 2016-09-16 DIAGNOSIS — J320 Chronic maxillary sinusitis: Secondary | ICD-10-CM | POA: Diagnosis not present

## 2016-10-28 DIAGNOSIS — R030 Elevated blood-pressure reading, without diagnosis of hypertension: Secondary | ICD-10-CM | POA: Diagnosis not present

## 2016-10-28 DIAGNOSIS — R51 Headache: Secondary | ICD-10-CM | POA: Diagnosis not present

## 2016-10-28 DIAGNOSIS — R3989 Other symptoms and signs involving the genitourinary system: Secondary | ICD-10-CM | POA: Diagnosis not present

## 2017-03-01 DIAGNOSIS — Z23 Encounter for immunization: Secondary | ICD-10-CM | POA: Diagnosis not present

## 2017-03-01 DIAGNOSIS — Z1159 Encounter for screening for other viral diseases: Secondary | ICD-10-CM | POA: Diagnosis not present

## 2017-03-01 DIAGNOSIS — E559 Vitamin D deficiency, unspecified: Secondary | ICD-10-CM | POA: Diagnosis not present

## 2017-03-01 DIAGNOSIS — Z0000 Encounter for general adult medical examination without abnormal findings: Secondary | ICD-10-CM | POA: Diagnosis not present

## 2017-03-01 DIAGNOSIS — E785 Hyperlipidemia, unspecified: Secondary | ICD-10-CM | POA: Diagnosis not present

## 2017-03-07 DIAGNOSIS — L259 Unspecified contact dermatitis, unspecified cause: Secondary | ICD-10-CM | POA: Diagnosis not present

## 2017-03-22 DIAGNOSIS — Z01419 Encounter for gynecological examination (general) (routine) without abnormal findings: Secondary | ICD-10-CM | POA: Diagnosis not present

## 2017-03-22 DIAGNOSIS — Z1231 Encounter for screening mammogram for malignant neoplasm of breast: Secondary | ICD-10-CM | POA: Diagnosis not present

## 2017-06-02 DIAGNOSIS — Z23 Encounter for immunization: Secondary | ICD-10-CM | POA: Diagnosis not present

## 2017-10-04 DIAGNOSIS — J069 Acute upper respiratory infection, unspecified: Secondary | ICD-10-CM | POA: Diagnosis not present

## 2017-12-22 DIAGNOSIS — Z1211 Encounter for screening for malignant neoplasm of colon: Secondary | ICD-10-CM | POA: Diagnosis not present

## 2017-12-22 DIAGNOSIS — K5190 Ulcerative colitis, unspecified, without complications: Secondary | ICD-10-CM | POA: Diagnosis not present

## 2018-02-21 ENCOUNTER — Other Ambulatory Visit: Payer: Self-pay | Admitting: Obstetrics and Gynecology

## 2018-02-21 DIAGNOSIS — Z1231 Encounter for screening mammogram for malignant neoplasm of breast: Secondary | ICD-10-CM

## 2018-03-17 ENCOUNTER — Ambulatory Visit
Admission: RE | Admit: 2018-03-17 | Discharge: 2018-03-17 | Disposition: A | Discharge status: Home / Self Care | Payer: 59 | Source: Ambulatory Visit | Attending: Obstetrics and Gynecology | Admitting: Obstetrics and Gynecology

## 2018-03-17 DIAGNOSIS — Z1231 Encounter for screening mammogram for malignant neoplasm of breast: Secondary | ICD-10-CM

## 2018-04-18 DIAGNOSIS — Z23 Encounter for immunization: Secondary | ICD-10-CM | POA: Diagnosis not present

## 2018-04-18 DIAGNOSIS — E785 Hyperlipidemia, unspecified: Secondary | ICD-10-CM | POA: Diagnosis not present

## 2018-04-18 DIAGNOSIS — E559 Vitamin D deficiency, unspecified: Secondary | ICD-10-CM | POA: Diagnosis not present

## 2018-04-18 DIAGNOSIS — Z0000 Encounter for general adult medical examination without abnormal findings: Secondary | ICD-10-CM | POA: Diagnosis not present

## 2018-04-18 DIAGNOSIS — E039 Hypothyroidism, unspecified: Secondary | ICD-10-CM | POA: Diagnosis not present

## 2018-06-06 DIAGNOSIS — E785 Hyperlipidemia, unspecified: Secondary | ICD-10-CM | POA: Diagnosis not present

## 2018-06-06 DIAGNOSIS — Z79899 Other long term (current) drug therapy: Secondary | ICD-10-CM | POA: Diagnosis not present

## 2018-06-22 DIAGNOSIS — Z130 Encounter for screening for diseases of the blood and blood-forming organs and certain disorders involving the immune mechanism: Secondary | ICD-10-CM | POA: Diagnosis not present

## 2018-06-22 DIAGNOSIS — Z6826 Body mass index (BMI) 26.0-26.9, adult: Secondary | ICD-10-CM | POA: Diagnosis not present

## 2018-06-22 DIAGNOSIS — Z01419 Encounter for gynecological examination (general) (routine) without abnormal findings: Secondary | ICD-10-CM | POA: Diagnosis not present

## 2018-06-23 DIAGNOSIS — Z124 Encounter for screening for malignant neoplasm of cervix: Secondary | ICD-10-CM | POA: Diagnosis not present

## 2018-08-02 DIAGNOSIS — K5190 Ulcerative colitis, unspecified, without complications: Secondary | ICD-10-CM | POA: Diagnosis not present

## 2018-08-02 DIAGNOSIS — Z1211 Encounter for screening for malignant neoplasm of colon: Secondary | ICD-10-CM | POA: Diagnosis not present

## 2019-01-17 ENCOUNTER — Other Ambulatory Visit: Payer: Self-pay | Admitting: Obstetrics and Gynecology

## 2019-01-17 DIAGNOSIS — Z1231 Encounter for screening mammogram for malignant neoplasm of breast: Secondary | ICD-10-CM

## 2019-01-18 ENCOUNTER — Other Ambulatory Visit: Payer: Self-pay | Admitting: Obstetrics and Gynecology

## 2019-01-18 DIAGNOSIS — E2839 Other primary ovarian failure: Secondary | ICD-10-CM

## 2019-04-03 ENCOUNTER — Ambulatory Visit: Payer: 59

## 2019-04-03 ENCOUNTER — Other Ambulatory Visit: Payer: 59

## 2019-04-09 ENCOUNTER — Ambulatory Visit: Payer: 59

## 2019-04-09 ENCOUNTER — Other Ambulatory Visit: Payer: 59

## 2019-05-18 ENCOUNTER — Ambulatory Visit
Admission: RE | Admit: 2019-05-18 | Discharge: 2019-05-18 | Disposition: A | Discharge status: Home / Self Care | Payer: 59 | Source: Ambulatory Visit | Attending: Obstetrics and Gynecology | Admitting: Obstetrics and Gynecology

## 2019-05-18 ENCOUNTER — Other Ambulatory Visit: Payer: Self-pay

## 2019-05-18 DIAGNOSIS — Z1231 Encounter for screening mammogram for malignant neoplasm of breast: Secondary | ICD-10-CM

## 2019-05-18 DIAGNOSIS — E2839 Other primary ovarian failure: Secondary | ICD-10-CM

## 2019-09-19 ENCOUNTER — Other Ambulatory Visit: Payer: Self-pay | Admitting: Obstetrics and Gynecology

## 2019-09-19 DIAGNOSIS — Z1231 Encounter for screening mammogram for malignant neoplasm of breast: Secondary | ICD-10-CM

## 2020-05-29 ENCOUNTER — Ambulatory Visit: Payer: 59

## 2020-06-10 ENCOUNTER — Other Ambulatory Visit: Payer: Self-pay

## 2020-06-10 ENCOUNTER — Ambulatory Visit
Admission: RE | Admit: 2020-06-10 | Discharge: 2020-06-10 | Disposition: A | Discharge status: Home / Self Care | Payer: 59 | Source: Ambulatory Visit | Attending: Obstetrics and Gynecology | Admitting: Obstetrics and Gynecology

## 2020-06-10 DIAGNOSIS — Z1231 Encounter for screening mammogram for malignant neoplasm of breast: Secondary | ICD-10-CM

## 2020-06-16 ENCOUNTER — Ambulatory Visit: Payer: 59

## 2022-01-07 IMAGING — MG DIGITAL SCREENING BILAT W/ TOMO W/ CAD
6 of 10 series · 6 of 30 positions shown · non-contrast
Comparison: Previous exam(s).

ACR Breast Density Category a: The breast tissue is almost entirely
fatty.

CLINICAL DATA: Screening.

EXAM:
DIGITAL SCREENING BILATERAL MAMMOGRAM WITH TOMO AND CAD

[L MLO synth-2D (1 of 2)]
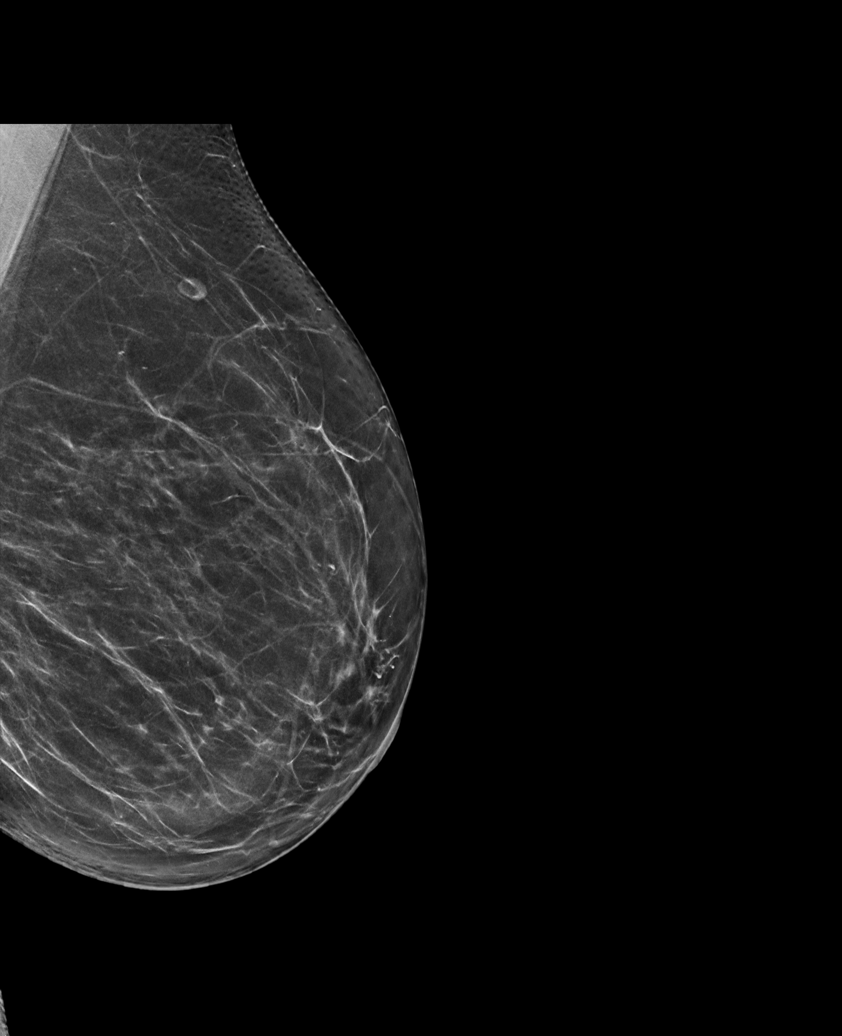

[R CC synth-2D]
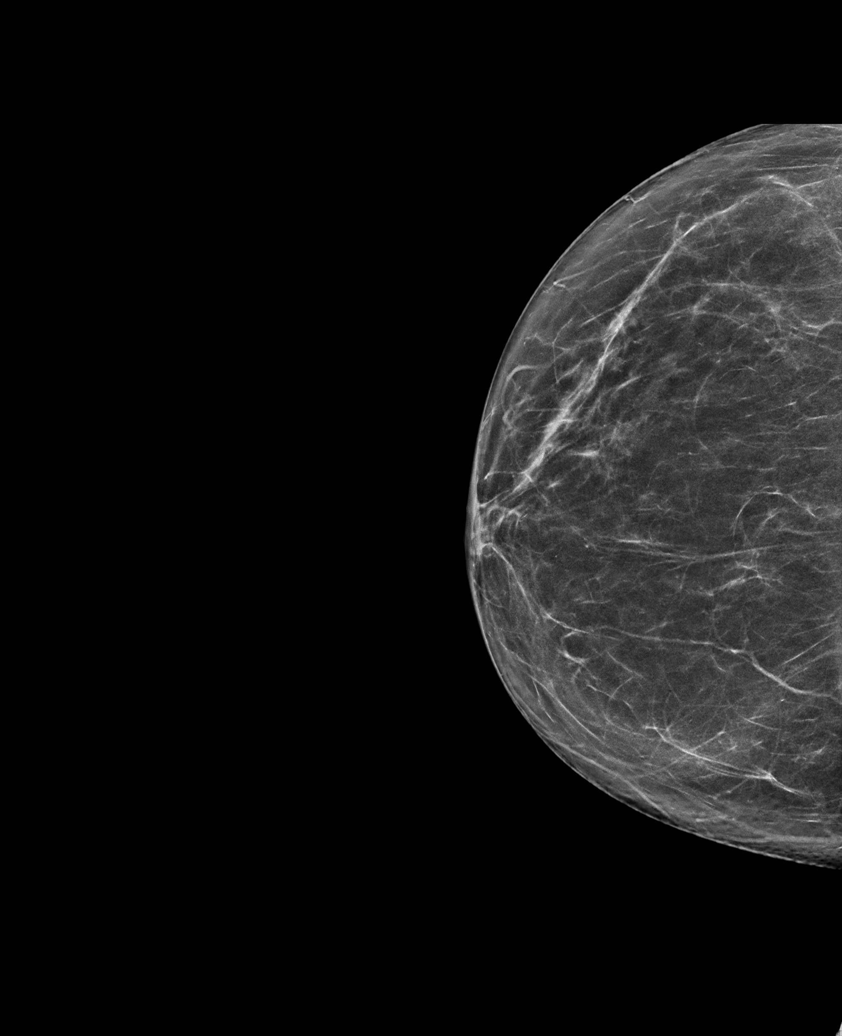

[L CC synth-2D]
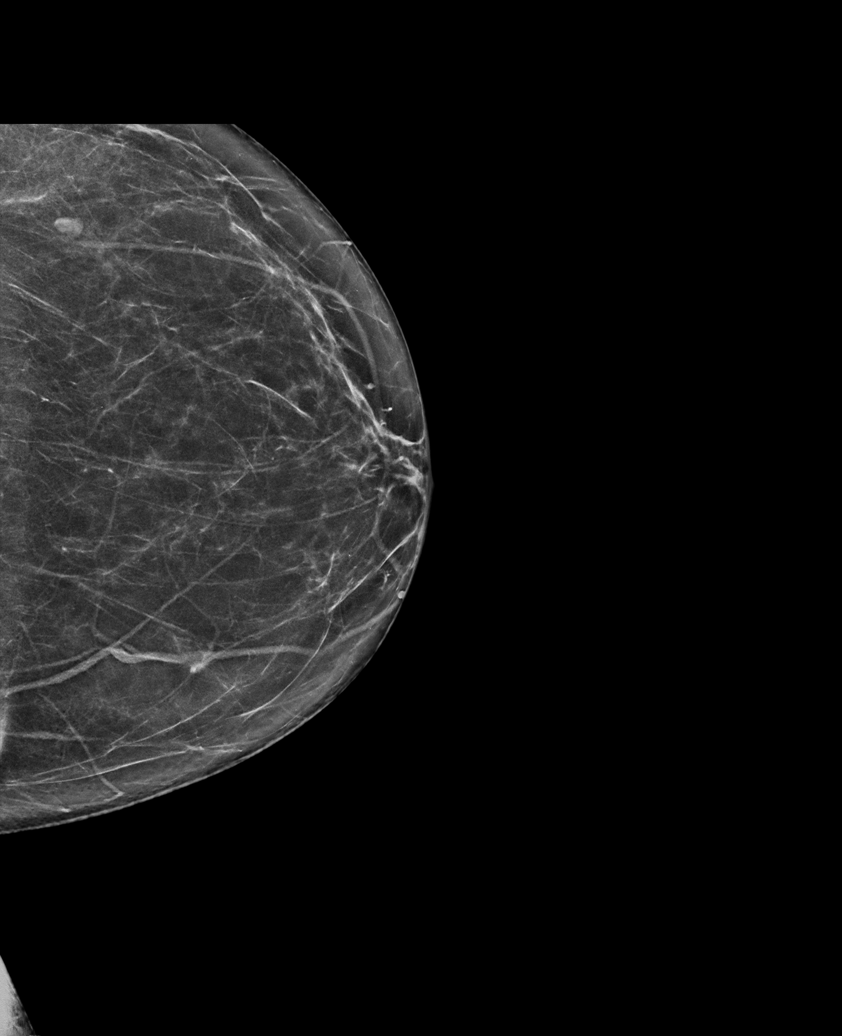

[L MLO synth-2D (2 of 2)]
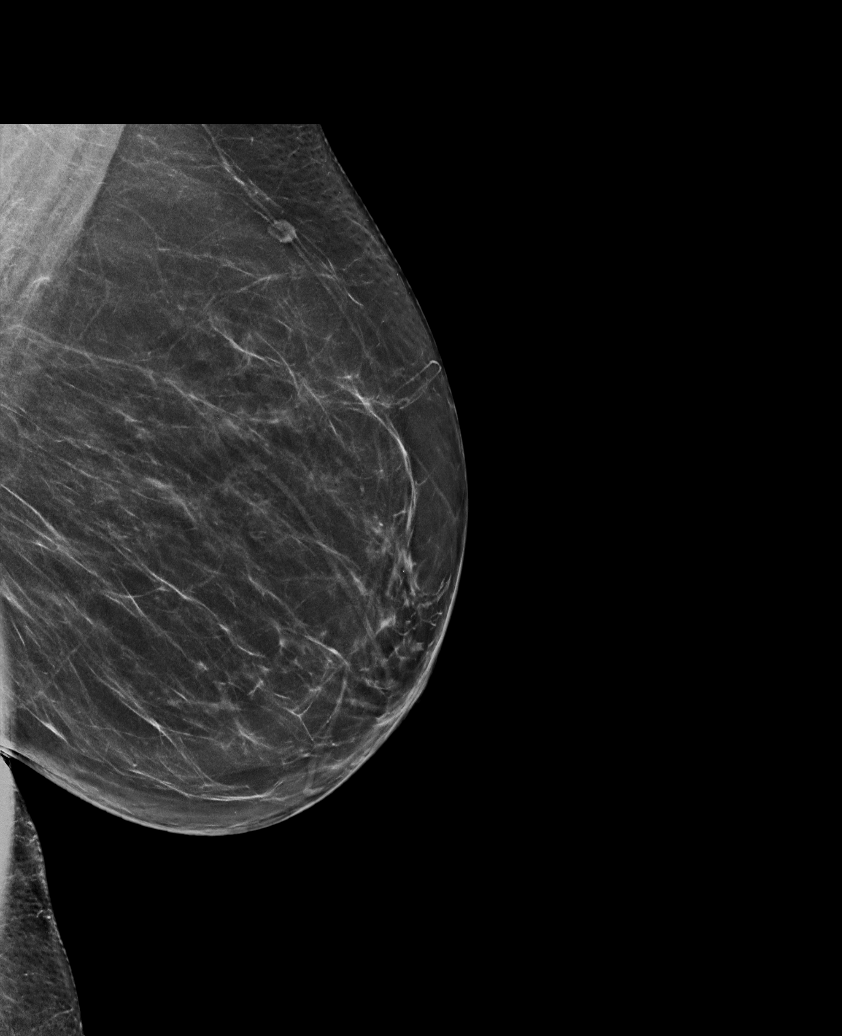

[R MLO synth-2D]
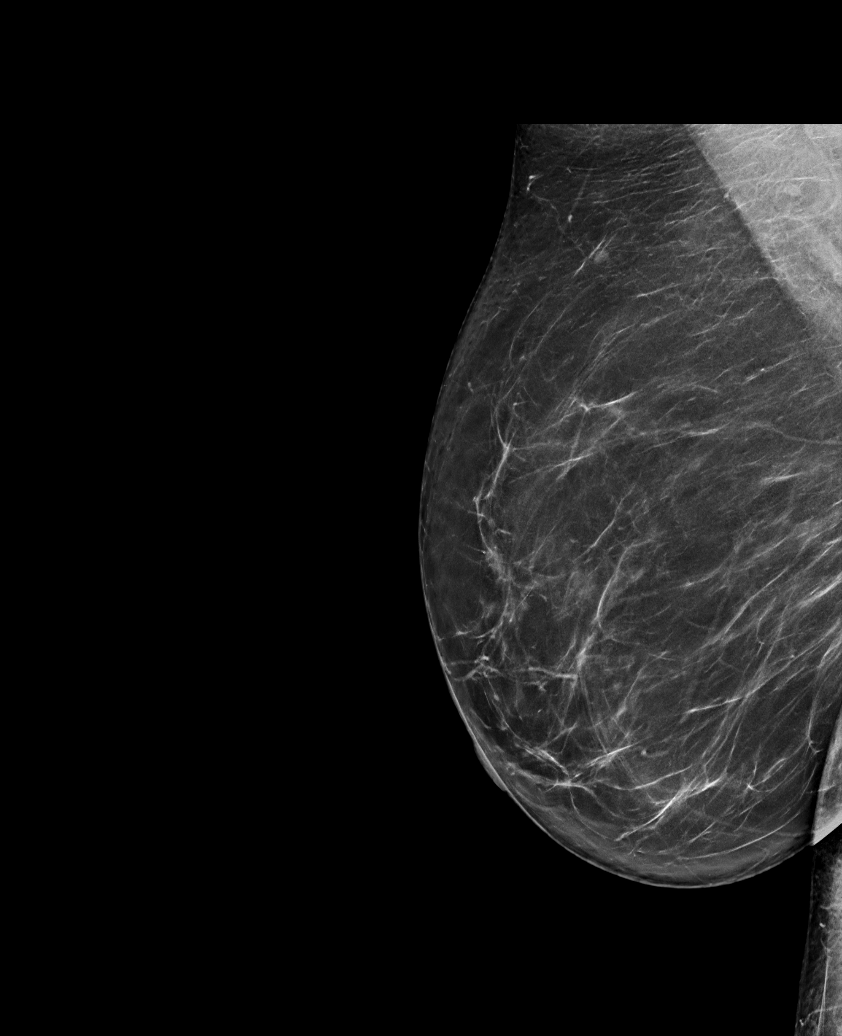

[R CC tomo · tomo slice 35/68.0]
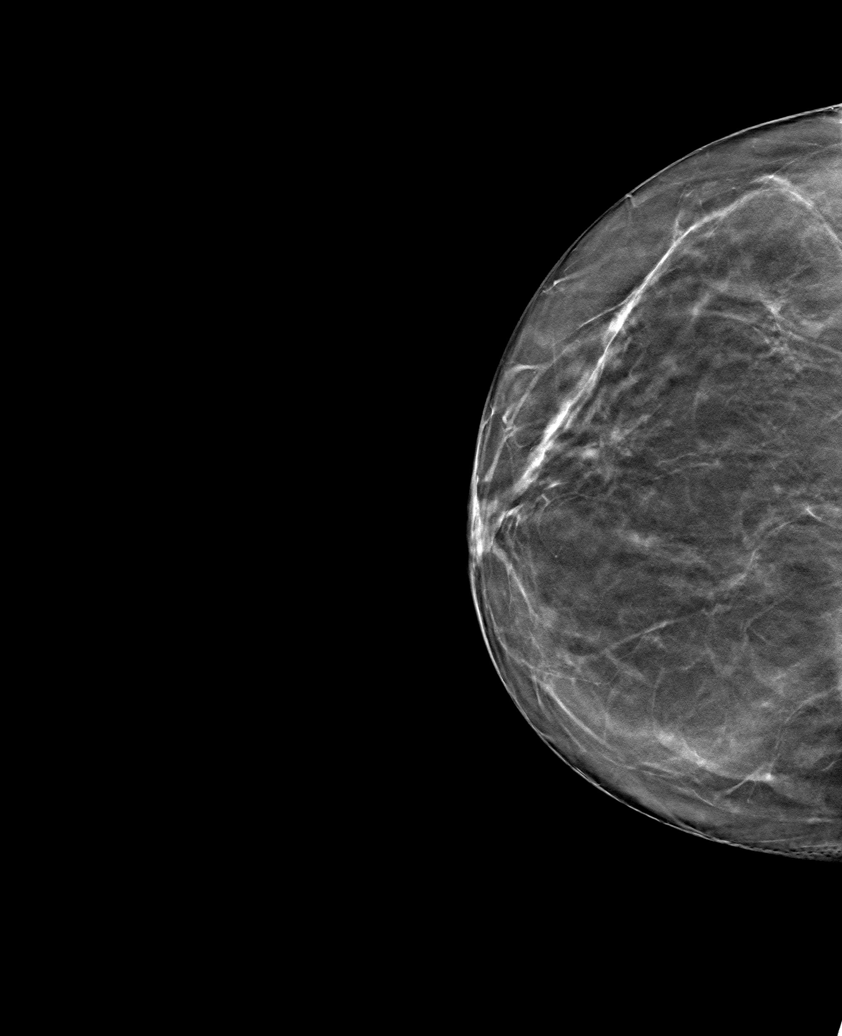

[6 of 30 positions shown; findings below may reference images not displayed]

FINDINGS: There are no findings suspicious for malignancy. Images were
processed with CAD.
IMPRESSION: No mammographic evidence of malignancy. A result letter of this
screening mammogram will be mailed directly to the patient.

RECOMMENDATION:
Screening mammogram in one year. (Code:[id number]-[id number])

BI-RADS CATEGORY  1: Negative.
